# Patient Record
Sex: Female | Born: 1985 | Race: Black or African American | Hispanic: No | Marital: Single | Smoking: Never smoker
Health system: Southern US, Community
[De-identification: ages and names within clinical notes are randomized; demographics above are authoritative.]

## PROBLEM LIST (undated history)

## (undated) DIAGNOSIS — N760 Acute vaginitis: Secondary | ICD-10-CM

## (undated) DIAGNOSIS — B9689 Other specified bacterial agents as the cause of diseases classified elsewhere: Secondary | ICD-10-CM

## (undated) DIAGNOSIS — Z6832 Body mass index (BMI) 32.0-32.9, adult: Secondary | ICD-10-CM

## (undated) DIAGNOSIS — A749 Chlamydial infection, unspecified: Secondary | ICD-10-CM

## (undated) HISTORY — PX: WISDOM TOOTH EXTRACTION: SHX21

## (undated) HISTORY — DX: Body mass index (BMI) 32.0-32.9, adult: Z68.32

## (undated) HISTORY — DX: Chlamydial infection, unspecified: A74.9

## (undated) HISTORY — DX: Other specified bacterial agents as the cause of diseases classified elsewhere: B96.89

## (undated) HISTORY — DX: Other specified bacterial agents as the cause of diseases classified elsewhere: N76.0

---

## 2007-05-08 ENCOUNTER — Emergency Department: Payer: Self-pay | Admitting: Emergency Medicine

## 2007-05-10 ENCOUNTER — Emergency Department: Payer: Self-pay | Admitting: Emergency Medicine

## 2011-02-18 ENCOUNTER — Observation Stay: Payer: Self-pay

## 2011-02-19 ENCOUNTER — Inpatient Hospital Stay: Payer: Self-pay

## 2013-04-24 ENCOUNTER — Ambulatory Visit: Payer: Self-pay

## 2017-02-22 ENCOUNTER — Ambulatory Visit: Payer: Self-pay | Admitting: Certified Nurse Midwife

## 2017-02-23 ENCOUNTER — Ambulatory Visit (INDEPENDENT_AMBULATORY_CARE_PROVIDER_SITE_OTHER): Payer: BLUE CROSS/BLUE SHIELD | Admitting: Obstetrics and Gynecology

## 2017-02-23 ENCOUNTER — Encounter: Payer: Self-pay | Admitting: Obstetrics and Gynecology

## 2017-02-23 VITALS — BP 110/60 | HR 68 | Wt 220.0 lb

## 2017-02-23 DIAGNOSIS — N3001 Acute cystitis with hematuria: Secondary | ICD-10-CM

## 2017-02-23 DIAGNOSIS — R350 Frequency of micturition: Secondary | ICD-10-CM

## 2017-02-23 LAB — POCT URINALYSIS DIPSTICK
BILIRUBIN UA: NEGATIVE
Glucose, UA: NEGATIVE
Nitrite, UA: NEGATIVE
PH UA: 6 (ref 5.0–8.0)
PROTEIN UA: NEGATIVE
SPEC GRAV UA: 1.01 (ref 1.010–1.025)
Urobilinogen, UA: NEGATIVE E.U./dL — AB

## 2017-02-23 MED ORDER — NITROFURANTOIN MONOHYD MACRO 100 MG PO CAPS: 100.0000 mg | ORAL_CAPSULE | Freq: Two times a day (BID) | ORAL | 0 refills | Status: AC

## 2017-02-23 NOTE — Progress Notes (Signed)
Chief Complaint  Patient presents with  . Urinary Tract Infection    Urinary Frequency not much comes out x few days    HPI:      Brittany Allen is a 31 y.o. No obstetric history on file. who LMP was Patient's last menstrual period was 01/31/2017 (exact date)., presents today for urinary frequency, dysuria for the past 6 days. She denies any LBP, belly pain, fevers, vag sx, or vaginal bleeding. She had a UTI yrs ago. She has not used any meds to treat.    There are no active problems to display for this patient. History reviewed. No pertinent past medical history.   Family History  Problem Relation Age of Onset  . Colon cancer Mother 5952  . Lymphoma Father 5660    Social History   Social History  . Marital status: Single    Spouse name: N/A  . Number of children: N/A  . Years of education: N/A   Occupational History  . Not on file.   Social History Main Topics  . Smoking status: Never Smoker  . Smokeless tobacco: Never Used  . Alcohol use Yes  . Drug use: No  . Sexual activity: Yes    Partners: Male    Birth control/ protection: None   Other Topics Concern  . Not on file   Social History Narrative  . No narrative on file     Current Outpatient Prescriptions:  .  nitrofurantoin, macrocrystal-monohydrate, (MACROBID) 100 MG capsule, Take 1 capsule (100 mg total) by mouth 2 (two) times daily., Disp: 14 capsule, Rfl: 0  Review of Systems  Constitutional: Negative for fever.  Gastrointestinal: Negative for blood in stool, constipation, diarrhea, nausea and vomiting.  Genitourinary: Positive for dysuria and frequency. Negative for dyspareunia, flank pain, hematuria, urgency, vaginal bleeding, vaginal discharge and vaginal pain.  Musculoskeletal: Negative for back pain.  Skin: Negative for rash.    OBJECTIVE:   Vitals:  BP 110/60 (BP Location: Left Arm, Patient Position: Sitting, Cuff Size: Large)   Pulse 68   Wt 220 lb (99.8 kg)   LMP 01/31/2017 (Exact  Date)   Physical Exam  Constitutional: She is well-developed, well-nourished, and in no distress.  Abdominal: There is no CVA tenderness.  Nursing note and vitals reviewed.   Results: Results for orders placed or performed in visit on 02/23/17 (from the past 24 hour(s))  POCT urinalysis dipstick     Status: Abnormal   Collection Time: 02/23/17 11:07 AM  Result Value Ref Range   Color, UA Gold    Clarity, UA Clear    Glucose, UA Negative    Bilirubin, UA negative    Ketones, UA Trace    Spec Grav, UA 1.010 1.010 - 1.025   Blood, UA 2+    pH, UA 6.0 5.0 - 8.0   Protein, UA Negative    Urobilinogen, UA negative (A) 0.2 or 1.0 E.U./dL   Nitrite, UA Negative    Leukocytes, UA Large (3+) (A) Negative     Assessment/Plan: Acute cystitis with hematuria - Rx macrobid. Check C&S. F/u prn.  - Plan: Urine culture, nitrofurantoin, macrocrystal-monohydrate, (MACROBID) 100 MG capsule  Urinary frequency - Plan: POCT urinalysis dipstick, Urine culture   Meds ordered this encounter  Medications  . nitrofurantoin, macrocrystal-monohydrate, (MACROBID) 100 MG capsule    Sig: Take 1 capsule (100 mg total) by mouth 2 (two) times daily.    Dispense:  14 capsule    Refill:  0  Return if symptoms worsen or fail to improve.  Alicia B. Copland, PA-C 02/23/2017 11:17 AM

## 2017-02-25 LAB — URINE CULTURE

## 2017-07-26 ENCOUNTER — Encounter: Payer: Self-pay | Admitting: Maternal Newborn

## 2017-07-26 ENCOUNTER — Ambulatory Visit (INDEPENDENT_AMBULATORY_CARE_PROVIDER_SITE_OTHER): Payer: BLUE CROSS/BLUE SHIELD | Admitting: Maternal Newborn

## 2017-07-26 VITALS — BP 104/62 | HR 74 | Ht 66.0 in | Wt 215.0 lb

## 2017-07-26 DIAGNOSIS — Z01419 Encounter for gynecological examination (general) (routine) without abnormal findings: Secondary | ICD-10-CM

## 2017-07-26 DIAGNOSIS — Z113 Encounter for screening for infections with a predominantly sexual mode of transmission: Secondary | ICD-10-CM | POA: Diagnosis not present

## 2017-07-26 DIAGNOSIS — Z124 Encounter for screening for malignant neoplasm of cervix: Secondary | ICD-10-CM | POA: Diagnosis not present

## 2017-07-26 DIAGNOSIS — Z3169 Encounter for other general counseling and advice on procreation: Secondary | ICD-10-CM | POA: Diagnosis not present

## 2017-07-26 LAB — HM PAP SMEAR: HM Pap smear: NORMAL

## 2017-07-26 NOTE — Progress Notes (Signed)
Gynecology Annual Exam  PCP: System, Pcp Not In  Chief Complaint:  Chief Complaint  Patient presents with  . Gynecologic Exam    History of Present Illness: Patient is a 31 y.o. G1P1001 presents for annual exam. The patient has no complaints today.   LMP: Patient's last menstrual period was 07/22/2017 (exact date). Average Interval: regular, 28 days Duration of flow: 4-5 days Heavy Menses: no Clots: usually not, but noticed a few two cycles ago Intermenstrual Bleeding: usually not, but noticed some spotting a few days before last cycle Postcoital Bleeding: no Dysmenorrhea: no  The patient is sexually active. She currently uses nothing for contraception. She admits to dyspareunia.  The patient does not perform self breast exams.  There is no notable family history of breast or ovarian cancer in her family.  The patient wears seatbelts: yes.   The patient has regular exercise: yes.    The patient denies current symptoms of depression.    Review of Systems  Constitutional: Negative for chills, fever and weight loss.  HENT: Negative for congestion, sinus pain and sore throat.   Eyes: Negative.   Respiratory: Negative for cough, shortness of breath and wheezing.   Cardiovascular: Negative for chest pain and palpitations.  Gastrointestinal: Negative for abdominal pain, constipation, diarrhea, heartburn and nausea.  Genitourinary: Negative for dysuria, frequency and urgency.  Musculoskeletal: Negative.   Skin: Negative.   Neurological: Negative for dizziness, tingling and headaches.  Endo/Heme/Allergies: Negative.   Psychiatric/Behavioral: Negative for depression. The patient is not nervous/anxious.   All other systems reviewed and are negative.   Past Medical History:  Past Medical History:  Diagnosis Date  . Bacterial vaginitis   . Chlamydia     Past Surgical History:  History reviewed. No pertinent surgical history.  Gynecologic History:  Patient's last  menstrual period was 07/22/2017 (exact date). Contraception: none Last Pap: 12/28/2014 Results were: NIL  Obstetric History: G1P1001  Family History:  Family History  Problem Relation Age of Onset  . Colon cancer Mother 70  . Lymphoma Father 60  . Diabetes Maternal Aunt     Social History:  Social History   Social History  . Marital status: Single    Spouse name: N/A  . Number of children: N/A  . Years of education: N/A   Occupational History  . Not on file.   Social History Main Topics  . Smoking status: Never Smoker  . Smokeless tobacco: Never Used  . Alcohol use Yes  . Drug use: No  . Sexual activity: Yes    Partners: Male    Birth control/ protection: None   Other Topics Concern  . Not on file   Social History Narrative  . No narrative on file    Allergies:  No Known Allergies  Medications: Prior to Admission medications   Not on File    Physical Exam Vitals: Blood pressure 104/62, pulse 74, height  (1.676 m), weight 215 lb (97.5 kg), last menstrual period 07/22/2017.  General: NAD HEENT: normocephalic, anicteric Thyroid: no enlargement, no palpable nodules Pulmonary: No increased work of breathing, CTAB Cardiovascular: RRR, distal pulses 2+ Breast: Breast symmetrical, no tenderness, no palpable nodules or masses, no skin or nipple retraction present, no nipple discharge.  No axillary or supraclavicular lymphadenopathy. Abdomen: NABS, soft, non-tender, non-distended.  Umbilicus without lesions.  No hepatomegaly, splenomegaly or masses palpable. No evidence of hernia  Genitourinary:  External: Normal external female genitalia.  Normal urethral meatus, normal  Bartholin's and Skene's  glands.    Vagina: Normal vaginal mucosa, no evidence of prolapse.    Cervix: Grossly normal in appearance, no bleeding  Uterus: Non-enlarged, mobile, normal contour.  No CMT  Adnexa: ovaries non-enlarged, no adnexal masses  Rectal: deferred  Lymphatic: no evidence  of inguinal lymphadenopathy Extremities: no edema, erythema, or tenderness Neurologic: Grossly intact Psychiatric: mood appropriate, affect full  Assessment: 31 y.o. G1P1001 routine annual exam. Patient is trying to conceive.  Plan: Problem List Items Addressed This Visit   Visit Diagnoses    Encounter for annual routine gynecological examination    -  Primary   Pap smear for cervical cancer screening       Relevant Orders   IGP,CtNgTv,Apt HPV,rfx16/18,45   Screen for STD (sexually transmitted disease)       Relevant Orders   IGP,CtNgTv,Apt HPV,rfx16/18,45   Pre-conception counseling          1) STI screening was offered and accepted.  2) ASCCP guidelines and rationale discussed.  Patient opts for every 3 year screening interval.  3) Conception - Patient desires pregnancy. Discussed timing of intercourse, signs of fertility, explained that she should start to take prenatal vitamins. Advised using lubrication to help with pain on initiation of intercourse. Has been trying for a few months. Advised to return to care if unsuccessful for > 1 year.  4) Routine healthcare maintenance including cholesterol, diabetes screening discussed: Declines.  5) Follow up 1 year for routine annual exam.  Marcelyn Bruins, CNM 07/26/2017  2:04 PM

## 2017-07-29 LAB — IGP,CTNGTV,APT HPV,RFX16/18,45
CHLAMYDIA, NUC. ACID AMP: NEGATIVE
Gonococcus, Nuc. Acid Amp: NEGATIVE
HPV APTIMA: NEGATIVE
PAP SMEAR COMMENT: 0
Trich vag by NAA: POSITIVE — AB

## 2017-07-30 ENCOUNTER — Other Ambulatory Visit: Payer: Self-pay | Admitting: Maternal Newborn

## 2017-07-30 DIAGNOSIS — A599 Trichomoniasis, unspecified: Secondary | ICD-10-CM

## 2017-07-30 MED ORDER — METRONIDAZOLE 500 MG PO TABS: 2000.0000 mg | ORAL_TABLET | Freq: Once | ORAL | 0 refills | Status: AC

## 2017-07-30 NOTE — Progress Notes (Signed)
Spoke with patient by phone and notified of positive test result for trichomoniasis; Rx for metronidazole 2g sent to pharmacy. Patient aware that partner should also be treated and to avoid alchool for 24 h after taking metronidazole.  Marcelyn BruinsJacelyn Schmid, CNM 07/30/2017  12:01 PM

## 2017-10-08 ENCOUNTER — Other Ambulatory Visit: Payer: Self-pay | Admitting: Maternal Newborn

## 2017-10-08 ENCOUNTER — Telehealth: Payer: Self-pay

## 2017-10-08 DIAGNOSIS — Z30011 Encounter for initial prescription of contraceptive pills: Secondary | ICD-10-CM

## 2017-10-08 MED ORDER — NORETHIN-ETH ESTRAD-FE BIPHAS 1 MG-10 MCG / 10 MCG PO TABS: 1.0000 | ORAL_TABLET | Freq: Every day | ORAL | 3 refills | Status: DC

## 2017-10-08 NOTE — Telephone Encounter (Signed)
Pt had discussed OCP w/JS at recent AE. Pt ready to start OCP & inquiring what next steps are. Cb#[phone]

## 2017-10-08 NOTE — Progress Notes (Signed)
Patient called to request OCPs, sent Rx for Lo LoEstrin after counseling by phone (annual exam with me 07/2017).  Marcelyn BruinsJacelyn Angel Hobdy, CNM 10/08/2017  4:53 PM

## 2017-10-15 NOTE — Telephone Encounter (Signed)
Pt p/u her OCP yesterday. She has a couple of questions in regards to starting it. Cb#[phone]

## 2017-10-15 NOTE — Telephone Encounter (Signed)
Pt states instructions in box say to start pack on first day of cycle. She remembers from previous OCP's to start on Sunday after cycle starts. Verified w/Jaci & relayed to patient ok to start on Sunday, just use back up for first month.

## 2019-01-27 ENCOUNTER — Other Ambulatory Visit: Payer: Self-pay | Admitting: Maternal Newborn

## 2019-01-27 DIAGNOSIS — Z30011 Encounter for initial prescription of contraceptive pills: Secondary | ICD-10-CM

## 2019-02-16 ENCOUNTER — Telehealth: Payer: Self-pay | Admitting: Maternal Newborn

## 2019-02-16 DIAGNOSIS — Z30011 Encounter for initial prescription of contraceptive pills: Secondary | ICD-10-CM

## 2019-02-16 NOTE — Telephone Encounter (Signed)
Patient scheduled for annual 6/29 with JS, will need refill on bc to get her to this appointment, [facility] [address].

## 2019-02-17 MED ORDER — NORETHIN-ETH ESTRAD-FE BIPHAS 1 MG-10 MCG / 10 MCG PO TABS: 1.0000 | ORAL_TABLET | Freq: Every day | ORAL | 0 refills | Status: DC

## 2019-02-17 NOTE — Telephone Encounter (Signed)
Pt aware.

## 2019-02-22 ENCOUNTER — Telehealth: Payer: Self-pay

## 2019-02-22 NOTE — Telephone Encounter (Signed)
Pt calling for refill of bc; pharm doesn't have it; she is out.  [phone]  Adv I will call pharm.  Pharm states needs PA.  Adv pt who states she has new ins.  Adv also sample up front for her to p/u.  To bring new ins card c her so we can scan it in.  Also, take new ins card to pharm so they can run it.

## 2019-04-10 ENCOUNTER — Other Ambulatory Visit: Payer: Self-pay

## 2019-04-10 ENCOUNTER — Encounter: Payer: Self-pay | Admitting: Maternal Newborn

## 2019-04-10 ENCOUNTER — Ambulatory Visit (INDEPENDENT_AMBULATORY_CARE_PROVIDER_SITE_OTHER): Payer: PRIVATE HEALTH INSURANCE | Admitting: Maternal Newborn

## 2019-04-10 VITALS — BP 110/70 | Ht 65.0 in | Wt 215.8 lb

## 2019-04-10 DIAGNOSIS — Z3041 Encounter for surveillance of contraceptive pills: Secondary | ICD-10-CM

## 2019-04-10 DIAGNOSIS — Z01419 Encounter for gynecological examination (general) (routine) without abnormal findings: Secondary | ICD-10-CM | POA: Diagnosis not present

## 2019-04-10 MED ORDER — LO LOESTRIN FE 1 MG-10 MCG / 10 MCG PO TABS: 1.0000 | ORAL_TABLET | Freq: Every day | ORAL | 3 refills | Status: DC

## 2019-04-10 NOTE — Progress Notes (Signed)
Gynecology Annual Exam  PCP: System, Pcp Not In  Chief Complaint:  Chief Complaint  Patient presents with  . Gynecologic Exam    no concerns    History of Present Illness: Patient is a 33 y.o. G1P1001 presenting for an annual exam. The patient has no complaints today.   LMP: Patient's last menstrual period was 04/04/2019 (exact date). Average Interval: regular, 28 days Duration of flow: 5 days Heavy Menses: no Clots: not usually, did have some with cycle before the previous one Intermenstrual Bleeding: no Postcoital Bleeding: no Dysmenorrhea: no  The patient is sexually active. She currently uses OCP (estrogen/progesterone) for contraception. She does not have dyspareunia.  The patient does perform self breast exams.  There is no notable family history of breast or ovarian cancer in her family.  The patient wears seatbelts: yes.   The patient has regular exercise: yes.    The patient does not have current symptoms of depression.    Review of Systems  Constitutional: Negative.   HENT: Negative.   Eyes: Negative.   Respiratory: Negative for shortness of breath and wheezing.   Cardiovascular: Negative for chest pain and palpitations.  Gastrointestinal: Negative for abdominal pain, diarrhea and nausea.  Genitourinary: Negative.   Musculoskeletal: Negative.   Skin: Negative.   Neurological: Negative.   Endo/Heme/Allergies: Negative.   Psychiatric/Behavioral: Negative.     Past Medical History:  Past Medical History:  Diagnosis Date  . Bacterial vaginitis   . Chlamydia     Past Surgical History:  History reviewed. No pertinent surgical history.  Gynecologic History:  Patient's last menstrual period was 04/04/2019 (exact date). Contraception: OCP (estrogen/progesterone) Last Pap: 07/26/2017. Results were: NIL and HR HPV negative   Obstetric History: G1P1001  Family History:  Family History  Problem Relation Age of Onset  . Colon cancer Mother 2852  .  Lymphoma Father 3160  . Diabetes Maternal Aunt     Social History:  Social History   Socioeconomic History  . Marital status: Single    Spouse name: Not on file  . Number of children: Not on file  . Years of education: Not on file  . Highest education level: Not on file  Occupational History  . Not on file  Social Needs  . Financial resource strain: Not on file  . Food insecurity    Worry: Not on file    Inability: Not on file  . Transportation needs    Medical: Not on file    Non-medical: Not on file  Tobacco Use  . Smoking status: Never Smoker  . Smokeless tobacco: Never Used  Substance and Sexual Activity  . Alcohol use: Yes    Comment: occ  . Drug use: No  . Sexual activity: Yes    Partners: Male    Birth control/protection: Pill  Lifestyle  . Physical activity    Days per week: Not on file    Minutes per session: Not on file  . Stress: Not on file  Relationships  . Social Musicianconnections    Talks on phone: Not on file    Gets together: Not on file    Attends religious service: Not on file    Active member of club or organization: Not on file    Attends meetings of clubs or organizations: Not on file    Relationship status: Not on file  . Intimate partner violence    Fear of current or ex partner: Not on file    Emotionally abused:  Not on file    Physically abused: Not on file    Forced sexual activity: Not on file  Other Topics Concern  . Not on file  Social History Narrative  . Not on file    Allergies:  No Known Allergies  Medications: Prior to Admission medications   Medication Sig Start Date End Date Taking? Authorizing Provider  Norethindrone-Ethinyl Estradiol-Fe Biphas (LO LOESTRIN FE) 1 MG-10 MCG / 10 MCG tablet Take 1 tablet by mouth daily. 02/17/19   Rexene Agent, CNM    Physical Exam Vitals: Blood pressure 110/70, height 5\' 5"  (1.651 m), weight 215 lb 12.8 oz (97.9 kg), last menstrual period 04/04/2019.  General: NAD HEENT:  normocephalic, anicteric Thyroid: no enlargement, no palpable nodules Pulmonary: No increased work of breathing, CTAB Cardiovascular: RRR, no murmurs, rubs, or gallops Breast: Breasts symmetrical, no tenderness, no palpable nodules or masses, no skin or nipple retraction present, no nipple discharge.  No axillary or supraclavicular lymphadenopathy. Abdomen: Soft, non-tender, non-distended.  Umbilicus without lesions.  No hepatomegaly, splenomegaly or masses palpable. No evidence of hernia  Genitourinary: Pelvic exam deferred after shared decision making, asymptomatic and no screening today Extremities: no edema, erythema, or tenderness Neurologic: Grossly intact Psychiatric: mood appropriate, affect full  Assessment: 33 y.o. G1P1001 annual exam  Plan: Problem List Items Addressed This Visit    None    Visit Diagnoses    Women's annual routine gynecological examination    -  Primary   Oral contraceptive pill surveillance       Relevant Medications   Norethindrone-Ethinyl Estradiol-Fe Biphas (LO LOESTRIN FE) 1 MG-10 MCG / 10 MCG tablet      1) STI screening was offered and declined  2) ASCCP guidelines and rationale discussed.  Patient opts for every 3 year screening interval  3) Contraception - She wishes to continue on oral contraceptives. No adverse effects and she is satisfied with this method.  4) Routine healthcare maintenance including cholesterol, diabetes screening: Declines  5) Follow up 1 year for annual exam.  Avel Sensor, CNM 04/10/2019  2:00 PM

## 2019-04-10 NOTE — Patient Instructions (Signed)
Preventive Care 21-33 Years Old, Female Preventive care refers to visits with your health care provider and lifestyle choices that can promote health and wellness. This includes:  A yearly physical exam. This may also be called an annual well check.  Regular dental visits and eye exams.  Immunizations.  Screening for certain conditions.  Healthy lifestyle choices, such as eating a healthy diet, getting regular exercise, not using drugs or products that contain nicotine and tobacco, and limiting alcohol use. What can I expect for my preventive care visit? Physical exam Your health care provider will check your:  Height and weight. This may be used to calculate body mass index (BMI), which tells if you are at a healthy weight.  Heart rate and blood pressure.  Skin for abnormal spots. Counseling Your health care provider may ask you questions about your:  Alcohol, tobacco, and drug use.  Emotional well-being.  Home and relationship well-being.  Sexual activity.  Eating habits.  Work and work environment.  Method of birth control.  Menstrual cycle.  Pregnancy history. What immunizations do I need?  Influenza (flu) vaccine  This is recommended every year. Tetanus, diphtheria, and pertussis (Tdap) vaccine  You may need a Td booster every 10 years. Varicella (chickenpox) vaccine  You may need this if you have not been vaccinated. Human papillomavirus (HPV) vaccine  If recommended by your health care provider, you may need three doses over 6 months. Measles, mumps, and rubella (MMR) vaccine  You may need at least one dose of MMR. You may also need a second dose. Meningococcal conjugate (MenACWY) vaccine  One dose is recommended if you are age 19-21 years and a first-year college student living in a residence hall, or if you have one of several medical conditions. You may also need additional booster doses. Pneumococcal conjugate (PCV13) vaccine  You may need  this if you have certain conditions and were not previously vaccinated. Pneumococcal polysaccharide (PPSV23) vaccine  You may need one or two doses if you smoke cigarettes or if you have certain conditions. Hepatitis A vaccine  You may need this if you have certain conditions or if you travel or work in places where you may be exposed to hepatitis A. Hepatitis B vaccine  You may need this if you have certain conditions or if you travel or work in places where you may be exposed to hepatitis B. Haemophilus influenzae type b (Hib) vaccine  You may need this if you have certain conditions. You may receive vaccines as individual doses or as more than one vaccine together in one shot (combination vaccines). Talk with your health care provider about the risks and benefits of combination vaccines. What tests do I need?  Blood tests  Lipid and cholesterol levels. These may be checked every 5 years starting at age 20.  Hepatitis C test.  Hepatitis B test. Screening  Diabetes screening. This is done by checking your blood sugar (glucose) after you have not eaten for a while (fasting).  Sexually transmitted disease (STD) testing.  BRCA-related cancer screening. This may be done if you have a family history of breast, ovarian, tubal, or peritoneal cancers.  Pelvic exam and Pap test. This may be done every 3 years starting at age 21. Starting at age 30, this may be done every 5 years if you have a Pap test in combination with an HPV test. Talk with your health care provider about your test results, treatment options, and if necessary, the need for more tests.   Follow these instructions at home: Eating and drinking   Eat a diet that includes fresh fruits and vegetables, whole grains, lean protein, and low-fat dairy.  Take vitamin and mineral supplements as recommended by your health care provider.  Do not drink alcohol if: ? Your health care provider tells you not to drink. ? You are  pregnant, may be pregnant, or are planning to become pregnant.  If you drink alcohol: ? Limit how much you have to 0-1 drink a day. ? Be aware of how much alcohol is in your drink. In the [place] one drink equals one 12 oz bottle of beer (355 mL), one 5 oz glass of wine (148 mL), or one 1 oz glass of hard liquor (44 mL). Lifestyle  Take daily care of your teeth and gums.  Stay active. Exercise for at least 30 minutes on 5 or more days each week.  Do not use any products that contain nicotine or tobacco, such as cigarettes, e-cigarettes, and chewing tobacco. If you need help quitting, ask your health care provider.  If you are sexually active, practice safe sex. Use a condom or other form of birth control (contraception) in order to prevent pregnancy and STIs (sexually transmitted infections). If you plan to become pregnant, see your health care provider for a preconception visit. What's next?  Visit your health care provider once a year for a well check visit.  Ask your health care provider how often you should have your eyes and teeth checked.  Stay up to date on all vaccines. This information is not intended to replace advice given to you by your health care provider. Make sure you discuss any questions you have with your health care provider. Document Released: 11/24/2001 Document Revised: 06/09/2018 Document Reviewed: 06/09/2018 Elsevier Patient Education  2020 [employer]vier Inc.  

## 2019-05-11 ENCOUNTER — Telehealth: Payer: Self-pay

## 2019-05-11 ENCOUNTER — Other Ambulatory Visit: Payer: Self-pay | Admitting: Obstetrics and Gynecology

## 2019-05-11 DIAGNOSIS — Z3041 Encounter for surveillance of contraceptive pills: Secondary | ICD-10-CM

## 2019-05-11 MED ORDER — LEVONORGESTREL-ETHINYL ESTRAD 0.1-20 MG-MCG PO TABS: 1.0000 | ORAL_TABLET | Freq: Every day | ORAL | 11 refills | Status: DC

## 2019-05-11 NOTE — Telephone Encounter (Signed)
Pt calling triage today requesting different OCP, one she is RX'ed is not covered by insurance. Please advise pt

## 2019-05-11 NOTE — Telephone Encounter (Signed)
Please advise. Thank you

## 2019-05-11 NOTE — Telephone Encounter (Signed)
Completed, please notify patient- thank you

## 2019-05-12 NOTE — Telephone Encounter (Signed)
Called pt to let her know the new rx was sent and she stated she wanted to ask [facility] what the difference was in a pill the insurance told her about which was Junel FE as appose to the [facility]

## 2019-05-12 NOTE — Telephone Encounter (Signed)
completed

## 2019-05-12 NOTE — Telephone Encounter (Signed)
Returned call. No answer. Left message.  Aviane has a slightly higher amount of estrogen but is similar to lolo.

## 2019-05-12 NOTE — Telephone Encounter (Signed)
Pt returning Dr. Gennette Pac call. Requesting return call. Cb#[phone]

## 2019-05-12 NOTE — Telephone Encounter (Signed)
Spoke w/pt. Inquired if she received Dr. Gennette Pac vm. She did, she states Dr. Gilman Schmidt asked her to call back. Msg to Dr. Gilman Schmidt.

## 2019-07-28 ENCOUNTER — Telehealth: Payer: Self-pay

## 2019-07-28 NOTE — Telephone Encounter (Signed)
Pt calling for refill of bcp; last one she is not sure insurance accepted; she's trying to figure out what she can get to be put back on bc.  [phone]  Pt aware refills are already at the pharm.  To contact them.

## 2020-04-12 ENCOUNTER — Ambulatory Visit: Payer: PRIVATE HEALTH INSURANCE | Admitting: Obstetrics

## 2020-04-17 ENCOUNTER — Ambulatory Visit: Payer: PRIVATE HEALTH INSURANCE | Admitting: Obstetrics and Gynecology

## 2020-05-21 ENCOUNTER — Ambulatory Visit (INDEPENDENT_AMBULATORY_CARE_PROVIDER_SITE_OTHER): Payer: PRIVATE HEALTH INSURANCE | Admitting: Obstetrics and Gynecology

## 2020-05-21 ENCOUNTER — Other Ambulatory Visit: Payer: Self-pay

## 2020-05-21 ENCOUNTER — Encounter: Payer: Self-pay | Admitting: Obstetrics and Gynecology

## 2020-05-21 VITALS — BP 122/74 | Ht 66.0 in | Wt 213.0 lb

## 2020-05-21 DIAGNOSIS — Z30011 Encounter for initial prescription of contraceptive pills: Secondary | ICD-10-CM

## 2020-05-21 DIAGNOSIS — Z01419 Encounter for gynecological examination (general) (routine) without abnormal findings: Secondary | ICD-10-CM | POA: Diagnosis not present

## 2020-05-21 MED ORDER — DROSPIRENONE-ETHINYL ESTRADIOL 3-0.02 MG PO TABS: 1.0000 | ORAL_TABLET | Freq: Every day | ORAL | 3 refills | Status: DC

## 2020-05-21 NOTE — Progress Notes (Signed)
PCP:  System, Pcp Not In   Chief Complaint  Patient presents with  . Annual Exam     HPI:      [name] is a 34 y.o. G1P1001 whose LMP was Patient's last menstrual period was 05/14/2020., presents today for her annual examination.  Her menses are regular every 28-30 days, lasting 4-5 days, mod flow.  Dysmenorrhea none. She does not have intermenstrual bleeding.  Sex activity: single partner, contraception - condoms. Was on OCPs until a few months ago; stopped due to insurance coverage. Would like to restart. No hx of HTN, DVTs, migraines with aura.  Last Pap: 07/26/17 Results were: no abnormalities /neg HPV DNA   There is no FH of breast cancer. There is no FH of ovarian cancer. The patient does not do self-breast exams. She has a FH of colon cancer in her mom age 62. Doesn't qualify for cancer genetic testing but should start colonoscopies age 79.  Tobacco use: The patient denies current or previous tobacco use. Alcohol use: none No drug use.  Exercise: moderately active  She does get adequate calcium but not Vitamin D in her diet.    Upstream - 05/21/20 1515      Pregnancy Intention Screening   Does the patient want to become pregnant in the next year? Yes    Does the patient's partner want to become pregnant in the next year? Yes    Would the patient like to discuss contraceptive options today? Yes      Contraception Wrap Up   Contraception Counseling Provided Yes          The pregnancy intention screening data noted above was reviewed. Potential methods of contraception were discussed. The patient elected to proceed with Oral Contraceptive.    Past Medical History:  Diagnosis Date  . Bacterial vaginitis   . Chlamydia     History reviewed. No pertinent surgical history.  Family History  Problem Relation Age of Onset  . Colon cancer Mother 92  . Lymphoma Father 45  . Diabetes Maternal Aunt     Social History   Socioeconomic History  . Marital  status: Single    Spouse name: Not on file  . Number of children: Not on file  . Years of education: Not on file  . Highest education level: Not on file  Occupational History  . Not on file  Tobacco Use  . Smoking status: Never Smoker  . Smokeless tobacco: Never Used  Vaping Use  . Vaping Use: Never used  Substance and Sexual Activity  . Alcohol use: Yes    Comment: occ  . Drug use: No  . Sexual activity: Yes    Partners: Male    Birth control/protection: None  Other Topics Concern  . Not on file  Social History Narrative  . Not on file   Social Determinants of Health   Financial Resource Strain:   . Difficulty of Paying Living Expenses:   Food Insecurity:   . Worried About Programme researcher, broadcasting/film/video in the Last Year:   . Barista in the Last Year:   Transportation Needs:   . Freight forwarder (Medical):   Marland Kitchen Lack of Transportation (Non-Medical):   Physical Activity:   . Days of Exercise per Week:   . Minutes of Exercise per Session:   Stress:   . Feeling of Stress :   Social Connections:   . Frequency of Communication with Friends and Family:   .  Frequency of Social Gatherings with Friends and Family:   . Attends Religious Services:   . Active Member of Clubs or Organizations:   . Attends Banker Meetings:   Marland Kitchen Marital Status:   Intimate Partner Violence:   . Fear of Current or Ex-Partner:   . Emotionally Abused:   Marland Kitchen Physically Abused:   . Sexually Abused:      Current Outpatient Medications:  .  drospirenone-ethinyl estradiol (YAZ) 3-0.02 MG tablet, Take 1 tablet by mouth daily., Disp: 84 tablet, Rfl: 3     ROS:  Review of Systems  Constitutional: Negative for fatigue, fever and unexpected weight change.  Respiratory: Negative for cough, shortness of breath and wheezing.   Cardiovascular: Negative for chest pain, palpitations and leg swelling.  Gastrointestinal: Negative for blood in stool, constipation, diarrhea, nausea and  vomiting.  Endocrine: Negative for cold intolerance, heat intolerance and polyuria.  Genitourinary: Negative for dyspareunia, dysuria, flank pain, frequency, genital sores, hematuria, menstrual problem, pelvic pain, urgency, vaginal bleeding, vaginal discharge and vaginal pain.  Musculoskeletal: Negative for back pain, joint swelling and myalgias.  Skin: Negative for rash.  Neurological: Negative for dizziness, syncope, light-headedness, numbness and headaches.  Hematological: Negative for adenopathy.  Psychiatric/Behavioral: Negative for agitation, confusion, sleep disturbance and suicidal ideas. The patient is not nervous/anxious.   BREAST: No symptoms   Objective: BP 122/74   Ht 5\' 6"  (1.676 m)   Wt 213 lb (96.6 kg)   LMP 05/14/2020   BMI 34.38 kg/m    Physical Exam Constitutional:      Appearance: She is well-developed.  Genitourinary:     Vulva, vagina, cervix, uterus, right adnexa and left adnexa normal.     No vulval lesion or tenderness noted.     No vaginal discharge, erythema or tenderness.     No cervical polyp.     Uterus is not enlarged or tender.     No right or left adnexal mass present.     Right adnexa not tender.     Left adnexa not tender.  Neck:     Thyroid: No thyromegaly.  Cardiovascular:     Rate and Rhythm: Normal rate and regular rhythm.     Heart sounds: Normal heart sounds. No murmur heard.   Pulmonary:     Effort: Pulmonary effort is normal.     Breath sounds: Normal breath sounds.  Chest:     Breasts:        Right: No mass, nipple discharge, skin change or tenderness.        Left: No mass, nipple discharge, skin change or tenderness.  Abdominal:     Palpations: Abdomen is soft.     Tenderness: There is no abdominal tenderness. There is no guarding.  Musculoskeletal:        General: Normal range of motion.     Cervical back: Normal range of motion.  Neurological:     General: No focal deficit present.     Mental Status: She is alert and  oriented to person, place, and time.     Cranial Nerves: No cranial nerve deficit.  Skin:    General: Skin is warm and dry.  Psychiatric:        Mood and Affect: Mood normal.        Behavior: Behavior normal.        Thought Content: Thought content normal.        Judgment: Judgment normal.  Vitals reviewed.     Assessment/Plan: Encounter  for annual routine gynecological examination  Encounter for initial prescription of contraceptive pills - Plan: drospirenone-ethinyl estradiol (YAZ) 3-0.02 MG tablet; OCP start today after neg UPT or with next menses. Condoms. Rx eRxd. Will change if there is ins coverage. F/u prn.   Meds ordered this encounter  Medications  . drospirenone-ethinyl estradiol (YAZ) 3-0.02 MG tablet    Sig: Take 1 tablet by mouth daily.    Dispense:  84 tablet    Refill:  3    Order Specific Question:   Supervising Provider    Answer:   Nadara Mustard [[id number]]             GYN counsel adequate intake of calcium and vitamin D, diet and exercise     F/U  Return in about 1 year (around 05/21/2021).  Eain Mullendore B. Linzy Darling, PA-C 05/21/2020 3:37 PM

## 2020-05-21 NOTE — Patient Instructions (Signed)
I value your feedback and entrusting us with your care. If you get a [facility] patient survey, I would appreciate you taking the time to let us know about your experience today. Thank you!  As of September 21, 2019, your lab results will be released to your MyChart immediately, before I even have a chance to see them. Please give me time to review them and contact you if there are any abnormalities. Thank you for your patience.  

## 2020-07-28 ENCOUNTER — Emergency Department: Payer: HRSA Program

## 2020-07-28 ENCOUNTER — Emergency Department
Admission: EM | Admit: 2020-07-28 | Discharge: 2020-07-28 | Disposition: A | Discharge status: Home / Self Care | Payer: HRSA Program | Attending: Emergency Medicine | Admitting: Emergency Medicine

## 2020-07-28 ENCOUNTER — Other Ambulatory Visit: Payer: Self-pay

## 2020-07-28 DIAGNOSIS — U071 COVID-19: Secondary | ICD-10-CM | POA: Diagnosis not present

## 2020-07-28 DIAGNOSIS — N12 Tubulo-interstitial nephritis, not specified as acute or chronic: Secondary | ICD-10-CM | POA: Diagnosis not present

## 2020-07-28 DIAGNOSIS — R509 Fever, unspecified: Secondary | ICD-10-CM | POA: Diagnosis present

## 2020-07-28 LAB — CBC WITH DIFFERENTIAL/PLATELET
Abs Immature Granulocytes: 0.02 10*3/uL (ref 0.00–0.07)
Basophils Absolute: 0 10*3/uL (ref 0.0–0.1)
Basophils Relative: 0 %
Eosinophils Absolute: 0 10*3/uL (ref 0.0–0.5)
Eosinophils Relative: 0 %
HCT: 36.1 % (ref 36.0–46.0)
Hemoglobin: 11.5 g/dL — ABNORMAL LOW (ref 12.0–15.0)
Immature Granulocytes: 0 %
Lymphocytes Relative: 24 %
Lymphs Abs: 1.6 10*3/uL (ref 0.7–4.0)
MCH: 24.9 pg — ABNORMAL LOW (ref 26.0–34.0)
MCHC: 31.9 g/dL (ref 30.0–36.0)
MCV: 78.3 fL — ABNORMAL LOW (ref 80.0–100.0)
Monocytes Absolute: 0.4 10*3/uL (ref 0.1–1.0)
Monocytes Relative: 6 %
Neutro Abs: 4.6 10*3/uL (ref 1.7–7.7)
Neutrophils Relative %: 70 %
Platelets: 236 10*3/uL (ref 150–400)
RBC: 4.61 MIL/uL (ref 3.87–5.11)
RDW: 14.5 % (ref 11.5–15.5)
WBC: 6.5 10*3/uL (ref 4.0–10.5)
nRBC: 0 % (ref 0.0–0.2)

## 2020-07-28 LAB — URINALYSIS, COMPLETE (UACMP) WITH MICROSCOPIC
Bilirubin Urine: NEGATIVE
Glucose, UA: NEGATIVE mg/dL
Ketones, ur: 20 mg/dL — AB
Nitrite: NEGATIVE
Protein, ur: 100 mg/dL — AB
Specific Gravity, Urine: 1.027 (ref 1.005–1.030)
WBC, UA: >50 WBC/hpf — ABNORMAL HIGH (ref 0–5)
pH: 6 (ref 5.0–8.0)

## 2020-07-28 LAB — COMPREHENSIVE METABOLIC PANEL
ALT: 14 U/L (ref 0–44)
AST: 27 U/L (ref 15–41)
Albumin: 3.7 g/dL (ref 3.5–5.0)
Alkaline Phosphatase: 41 U/L (ref 38–126)
Anion gap: 12 (ref 5–15)
BUN: 8 mg/dL (ref 6–20)
CO2: 25 mmol/L (ref 22–32)
Calcium: 8.2 mg/dL — ABNORMAL LOW (ref 8.9–10.3)
Chloride: 100 mmol/L (ref 98–111)
Creatinine, Ser: 1.14 mg/dL — ABNORMAL HIGH (ref 0.44–1.00)
GFR, Estimated: >60 mL/min (ref >60)
Glucose, Bld: 102 mg/dL — ABNORMAL HIGH (ref 70–99)
Potassium: 3.4 mmol/L — ABNORMAL LOW (ref 3.5–5.1)
Sodium: 137 mmol/L (ref 135–145)
Total Bilirubin: 0.6 mg/dL (ref 0.3–1.2)
Total Protein: 8.1 g/dL (ref 6.5–8.1)

## 2020-07-28 LAB — RESPIRATORY PANEL BY RT PCR (FLU A&B, COVID)
Influenza A by PCR: NEGATIVE
Influenza B by PCR: NEGATIVE
SARS Coronavirus 2 by RT PCR: POSITIVE — AB

## 2020-07-28 LAB — POC URINE PREG, ED: Preg Test, Ur: NEGATIVE

## 2020-07-28 MED ORDER — ONDANSETRON 4 MG PO TBDP: 4.0000 mg | ORAL_TABLET | Freq: Three times a day (TID) | ORAL | 0 refills | Status: DC | PRN

## 2020-07-28 MED ORDER — ONDANSETRON HCL 4 MG/2ML IJ SOLN
4.0000 mg | Freq: Once | INTRAMUSCULAR | Status: AC
  Administered 2020-07-28: 4 mg via INTRAVENOUS
  Filled 2020-07-28: qty 2

## 2020-07-28 MED ORDER — ONDANSETRON 4 MG PO TBDP
4.0000 mg | ORAL_TABLET | Freq: Once | ORAL | Status: AC | PRN
  Administered 2020-07-28: 4 mg via ORAL
  Filled 2020-07-28: qty 1

## 2020-07-28 MED ORDER — SODIUM CHLORIDE 0.9 % IV SOLN
1.0000 g | Freq: Once | INTRAVENOUS | Status: AC
  Administered 2020-07-28: 1 g via INTRAVENOUS
  Filled 2020-07-28: qty 10

## 2020-07-28 MED ORDER — ACETAMINOPHEN 325 MG PO TABS
650.0000 mg | ORAL_TABLET | Freq: Once | ORAL | Status: AC
  Administered 2020-07-28: 650 mg via ORAL
  Filled 2020-07-28: qty 2

## 2020-07-28 MED ORDER — SODIUM CHLORIDE 0.9 % IV BOLUS
1000.0000 mL | Freq: Once | INTRAVENOUS | Status: AC
  Administered 2020-07-28: 1000 mL via INTRAVENOUS

## 2020-07-28 MED ORDER — CEFDINIR 300 MG PO CAPS: 300.0000 mg | ORAL_CAPSULE | Freq: Two times a day (BID) | ORAL | 0 refills | Status: DC

## 2020-07-28 NOTE — ED Notes (Signed)
Date and time results received: 07/28/20  (use smartphrase ".now" to insert current time)  Test: Covid Critical Value: positive  Name of Provider Notified: Greig Right  Orders Received? Or Actions Taken?: acknowledged

## 2020-07-28 NOTE — ED Provider Notes (Signed)
[facility] Emergency Department Provider Note  ____________________________________________   First MD Initiated Contact with Patient 07/28/20 1751     (approximate)  I have reviewed the triage vital signs and the nursing notes.   HISTORY  Chief Complaint COVID sx    HPI Brittany Allen is a 34 y.o. female presents emergency department with Covid-like symptoms.  She has had fever, chills, nausea/vomiting and fatigue since Tuesday.  No diarrhea.  States she does not really have abdominal pain but did have sharp pain on the left earlier in the week.  No dysuria.  No vaginal discharge.  No sore throat.    Past Medical History:  Diagnosis Date  . Bacterial vaginitis   . Chlamydia     Patient Active Problem List   Diagnosis Date Noted  . Trichomoniasis 07/30/2017    History reviewed. No pertinent surgical history.  Prior to Admission medications   Medication Sig Start Date End Date Taking? Authorizing Provider  cefdinir (OMNICEF) 300 MG capsule Take 1 capsule (300 mg total) by mouth 2 (two) times daily. 07/28/20   Elizibeth Breau, Roselyn Bering, PA-C  drospirenone-ethinyl estradiol (YAZ) 3-0.02 MG tablet Take 1 tablet by mouth daily. 05/21/20   Copland, Helmut Muster B, PA-C  ondansetron (ZOFRAN-ODT) 4 MG disintegrating tablet Take 1 tablet (4 mg total) by mouth every 8 (eight) hours as needed. 07/28/20   Faythe Ghee, PA-C    Allergies Patient has no known allergies.  Family History  Problem Relation Age of Onset  . Colon cancer Mother 32  . Lymphoma Father 79  . Diabetes Maternal Aunt     Social History Social History   Tobacco Use  . Smoking status: Never Smoker  . Smokeless tobacco: Never Used  Vaping Use  . Vaping Use: Never used  Substance Use Topics  . Alcohol use: Yes    Comment: occ  . Drug use: No    Review of Systems  Constitutional: Positive fever/chills Eyes: No visual changes. ENT: No sore throat. Respiratory: Denies  cough Cardiovascular: Denies chest pain Gastrointestinal: Denies abdominal pain, positive vomiting Genitourinary: Negative for dysuria. Musculoskeletal: Negative for back pain. Skin: Negative for rash. Psychiatric: no mood changes,     ____________________________________________   PHYSICAL EXAM:  VITAL SIGNS: ED Triage Vitals  Enc Vitals Group     BP 07/28/20 1644 (!) 98/55     Pulse Rate 07/28/20 1644 (!) 108     Resp 07/28/20 1644 20     Temp 07/28/20 1644 100.1 F (37.8 C)     Temp Source 07/28/20 1644 Oral     SpO2 07/28/20 1644 96 %     Weight 07/28/20 1645 200 lb (90.7 kg)     Height 07/28/20 1645 5\' 6"  (1.676 m)     Head Circumference --      Peak Flow --      Pain Score 07/28/20 1645 0     Pain Loc --      Pain Edu? --      Excl. in GC? --     Constitutional: Alert and oriented. Well appearing and in no acute distress. Eyes: Conjunctivae are normal.  Head: Atraumatic. Nose: No congestion/rhinnorhea. Mouth/Throat: Mucous membranes are moist.   Neck:  supple no lymphadenopathy noted Cardiovascular: Normal rate, regular rhythm. Heart sounds are normal Respiratory: Normal respiratory effort.  No retractions, lungs c t a  Abd: soft nontender bs normal all 4 quad, no CVA tenderness GU: deferred Musculoskeletal: FROM all extremities, warm and  well perfused Neurologic:  Normal speech and language.  Skin:  Skin is warm, dry and intact. No rash noted. Psychiatric: Mood and affect are normal. Speech and behavior are normal.  ____________________________________________   LABS (all labs ordered are listed, but only abnormal results are displayed)  Labs Reviewed  RESPIRATORY PANEL BY RT PCR (FLU A&B, COVID) - Abnormal; Notable for the following components:      Result Value   SARS Coronavirus 2 by RT PCR POSITIVE (*)    All other components within normal limits  URINALYSIS, COMPLETE (UACMP) WITH MICROSCOPIC - Abnormal; Notable for the following components:    Color, Urine AMBER (*)    APPearance CLOUDY (*)    Hgb urine dipstick MODERATE (*)    Ketones, ur 20 (*)    Protein, ur 100 (*)    Leukocytes,Ua LARGE (*)    WBC, UA >50 (*)    Bacteria, UA FEW (*)    All other components within normal limits  CBC WITH DIFFERENTIAL/PLATELET - Abnormal; Notable for the following components:   Hemoglobin 11.5 (*)    MCV 78.3 (*)    MCH 24.9 (*)    All other components within normal limits  COMPREHENSIVE METABOLIC PANEL - Abnormal; Notable for the following components:   Potassium 3.4 (*)    Glucose, Bld 102 (*)    Creatinine, Ser 1.14 (*)    Calcium 8.2 (*)    All other components within normal limits  POC URINE PREG, ED   ____________________________________________   ____________________________________________  RADIOLOGY    ____________________________________________   PROCEDURES  Procedure(s) performed: Rocephin 1 g IV   Procedures    ____________________________________________   INITIAL IMPRESSION / ASSESSMENT AND PLAN / ED COURSE  Pertinent labs & imaging results that were available during my care of the patient were reviewed by me and considered in my medical decision making (see chart for details).   Patient is 34 year old female presents emergency department with fever, chills and vomiting. She denies any back pain or abdominal pain. No diarrhea. No known exposure to Covid. The patient does appear to be stable with a low-grade temp. Heart rates a little elevated and I think this is mainly due to the vomiting. Will do labs to ensure she is not dehydrated or has a metabolic abnormality.  DDx: UTI, pyelonephritis, Covid, gastroenteritis  CBC is basically normal, comprehensive metabolic panel has decreased potassium at 3.4, creatinine is a little elevated mostly due to her dehydration, urinalysis has a large amount of white cells greater than 50 WBCs and few bacteria, POC pregnancy is negative, Covid test is positive  I did  discuss the findings with the patient and her mother who unfortunately had been let into her room.  Patient may be on approximately day 7 of the initial infection.  We will do a chest x-ray to ensure she does not have a multifocal pneumonia.  Patient is also to quarantine for 14 days.  She will have to notify her employer.  However I do want her to finish the antibiotic for the kidney infection.  Chest x-ray does show Covid opacities.  With the patient being unvaccinated I did send her chart to the [facility] infusion clinic via secure chat.  It was received by a NP who states they are adding her to their list.  I did explain this to the patient that if she has not heard from them by tomorrow afternoon that she should call the phone number herself.  She was discharged  in stable condition in the care of her mother.     Brittany Allen was evaluated in Emergency Department on 07/28/2020 for the symptoms described in the history of present illness. She was evaluated in the context of the global COVID-19 pandemic, which necessitated consideration that the patient might be at risk for infection with the SARS-CoV-2 virus that causes COVID-19. Institutional protocols and algorithms that pertain to the evaluation of patients at risk for COVID-19 are in a state of rapid change based on information released by regulatory bodies including the CDC and federal and state organizations. These policies and algorithms were followed during the patient's care in the ED.    As part of my medical decision making, I reviewed the following data within the electronic MEDICAL RECORD NUMBER History obtained from family, Nursing notes reviewed and incorporated, Labs reviewed , Old chart reviewed, Radiograph reviewed , Notes from prior ED visits and [place] Controlled Substance Database  ____________________________________________   FINAL CLINICAL IMPRESSION(S) / ED DIAGNOSES  Final diagnoses:  COVID-19  Pyelonephritis      NEW  MEDICATIONS STARTED DURING THIS VISIT:  New Prescriptions   CEFDINIR (OMNICEF) 300 MG CAPSULE    Take 1 capsule (300 mg total) by mouth 2 (two) times daily.   ONDANSETRON (ZOFRAN-ODT) 4 MG DISINTEGRATING TABLET    Take 1 tablet (4 mg total) by mouth every 8 (eight) hours as needed.     Note:  This document was prepared using Dragon voice recognition software and may include unintentional dictation errors.    Faythe Ghee, PA-C 07/28/20 Marlou Starks, MD 07/28/20 2352

## 2020-07-28 NOTE — Discharge Instructions (Addendum)
Follow-up with the infusion clinic.  If they have not called you by tomorrow afternoon please call them.  Return to the emergency department if worsening.  Take the medications as prescribed.

## 2020-07-28 NOTE — ED Triage Notes (Signed)
Pt to ED for nausea, vomiting, chills, fatigue since Tuesday.  RR even and unlabored, NAD noted, ambulatory.

## 2020-07-29 ENCOUNTER — Telehealth: Payer: Self-pay | Admitting: Physician Assistant

## 2020-07-29 NOTE — Telephone Encounter (Signed)
Called to Discuss with patient about Covid symptoms and the use of the monoclonal antibody infusion for those with mild to moderate Covid symptoms and at a high risk of hospitalization.     Pt appears to qualify for this infusion due to co-morbid conditions and/or a member of an at-risk group in accordance with the FDA Emergency Use Authorization.    I was able to speak with the patient about [facility]. She states she does not feel that sick, but does have fever, chills, shortness of breath, N/V, diarrhea, and dehydration. I sent information to her MyChart account to review. She will call the hotline if she is interested in scheduling.  Sx onset 07/24/20 Positive test in Epic 07/28/20.  Qualifies for BMI, ?SVI

## 2020-07-30 ENCOUNTER — Ambulatory Visit (HOSPITAL_COMMUNITY): Payer: Self-pay

## 2020-07-30 ENCOUNTER — Telehealth: Payer: Self-pay | Admitting: Physician Assistant

## 2020-07-30 ENCOUNTER — Encounter: Payer: Self-pay | Admitting: Physician Assistant

## 2020-07-30 DIAGNOSIS — Z6832 Body mass index (BMI) 32.0-32.9, adult: Secondary | ICD-10-CM | POA: Insufficient documentation

## 2020-07-30 NOTE — Telephone Encounter (Signed)
Called to discuss with [place] Caster about Covid symptoms and the use of casirivimab/imdevimab or bamlanivimab/etesevimab infusion for those with mild to moderate Covid symptoms and at a high risk of hospitalization.     Pt is qualified for this infusion at the monoclonal antibody infusion center due to co-morbid conditions and/or a member of an at-risk group, however declines infusion at this time. Symptoms tier reviewed as well as criteria for ending isolation.  Symptoms reviewed that would warrant ED/Hospital evaluation. Preventative practices reviewed. Patient verbalized understanding. Patient advised to call back if he decides that he does want to get infusion. Callback number to the infusion center given. Patient advised to go to Urgent care or ED with severe symptoms. Last date pt would be eligible for infusion is 10/19.    Patient Active Problem List   Diagnosis Date Noted  . BMI 32.0-32.9,adult   . Trichomoniasis 07/30/2017    Cline Crock PA-C

## 2021-12-03 IMAGING — DX DG CHEST 1V PORT
1 series · 1 of 1 positions shown · non-contrast
Comparison: None.

CLINICAL DATA: Cough and fevers, 25BMN-FQ positivity

EXAM:
PORTABLE CHEST 1 VIEW

[chest ap]
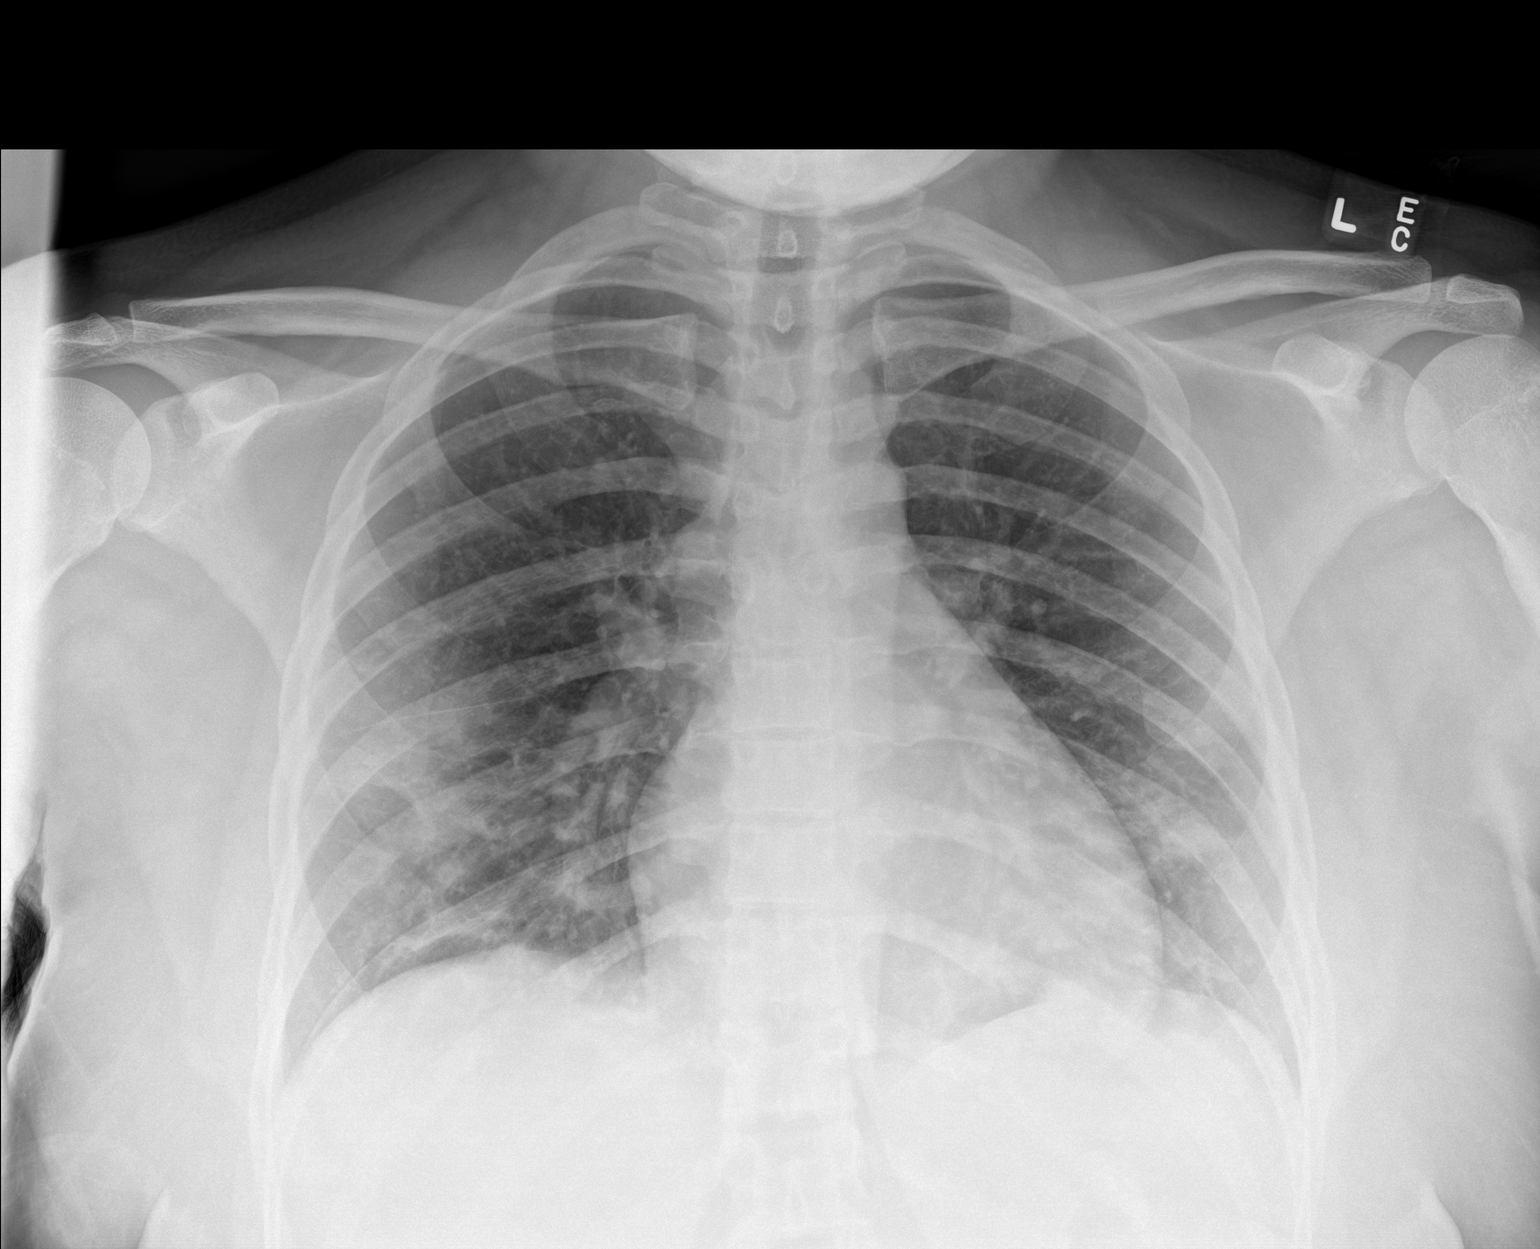

[1 of 1 positions shown; findings below may reference images not displayed]

FINDINGS: Cardiac shadow is within normal limits. The lungs are well aerated
bilaterally with patchy airspace opacities predominately in the
bases consistent with the given clinical history. No bony
abnormality is seen.
IMPRESSION: Patchy airspace opacity consistent with the given clinical history
of 25BMN-FQ.

## 2023-07-19 NOTE — Progress Notes (Unsigned)
PCP:  Pcp, No   No chief complaint on file.    HPI:      [name] is a 37 y.o. G1P1001 whose LMP was No LMP recorded., presents today for her NP> 3 yrs annual examination.  Her menses are regular every 28-30 days, lasting 4-5 days, mod flow.  Dysmenorrhea none. She does not have intermenstrual bleeding.  Sex activity: single partner, contraception - condoms. Was on OCPs until a few months ago; stopped due to insurance coverage. Would like to restart. No hx of HTN, DVTs, migraines with aura.  Last Pap: 07/26/17 Results were: no abnormalities /neg HPV DNA   There is no FH of breast cancer. There is no FH of ovarian cancer. The patient does not do self-breast exams. She has a FH of colon cancer in her mom age 84. Doesn't qualify for cancer genetic testing but should start colonoscopies age 54.  Tobacco use: The patient denies current or previous tobacco use. Alcohol use: none No drug use.  Exercise: moderately active  She does get adequate calcium but not Vitamin D in her diet.     The pregnancy intention screening data noted above was reviewed. Potential methods of contraception were discussed. The patient elected to proceed with Oral Contraceptive.    Past Medical History:  Diagnosis Date   Bacterial vaginitis    BMI 32.0-32.9,adult    Chlamydia     No past surgical history on file.  Family History  Problem Relation Age of Onset   Colon cancer Mother 34   Lymphoma Father 43   Diabetes Maternal Aunt     Social History   Socioeconomic History   Marital status: Single    Spouse name: Not on file   Number of children: Not on file   Years of education: Not on file   Highest education level: Not on file  Occupational History   Not on file  Tobacco Use   Smoking status: Never   Smokeless tobacco: Never  Vaping Use   Vaping status: Never Used  Substance and Sexual Activity   Alcohol use: Yes    Comment: occ   Drug use: No   Sexual activity: Yes     Partners: Male    Birth control/protection: None  Other Topics Concern   Not on file  Social History Narrative   Not on file   Social Determinants of Health   Financial Resource Strain: Not on file  Food Insecurity: Not on file  Transportation Needs: Not on file  Physical Activity: Not on file  Stress: Not on file  Social Connections: Not on file  Intimate Partner Violence: Not on file     Current Outpatient Medications:    cefdinir (OMNICEF) 300 MG capsule, Take 1 capsule (300 mg total) by mouth 2 (two) times daily., Disp: 20 capsule, Rfl: 0   drospirenone-ethinyl estradiol (YAZ) 3-0.02 MG tablet, Take 1 tablet by mouth daily., Disp: 84 tablet, Rfl: 3   ondansetron (ZOFRAN-ODT) 4 MG disintegrating tablet, Take 1 tablet (4 mg total) by mouth every 8 (eight) hours as needed., Disp: 20 tablet, Rfl: 0     ROS:  Review of Systems  Constitutional:  Negative for fatigue, fever and unexpected weight change.  Respiratory:  Negative for cough, shortness of breath and wheezing.   Cardiovascular:  Negative for chest pain, palpitations and leg swelling.  Gastrointestinal:  Negative for blood in stool, constipation, diarrhea, nausea and vomiting.  Endocrine: Negative for cold intolerance, heat intolerance and  polyuria.  Genitourinary:  Negative for dyspareunia, dysuria, flank pain, frequency, genital sores, hematuria, menstrual problem, pelvic pain, urgency, vaginal bleeding, vaginal discharge and vaginal pain.  Musculoskeletal:  Negative for back pain, joint swelling and myalgias.  Skin:  Negative for rash.  Neurological:  Negative for dizziness, syncope, light-headedness, numbness and headaches.  Hematological:  Negative for adenopathy.  Psychiatric/Behavioral:  Negative for agitation, confusion, sleep disturbance and suicidal ideas. The patient is not nervous/anxious.   BREAST: No symptoms   Objective: There were no vitals taken for this visit.   Physical Exam Constitutional:       Appearance: She is well-developed.  Genitourinary:     Vulva normal.     No vaginal discharge, erythema or tenderness.      Right Adnexa: not tender and no mass present.    Left Adnexa: not tender and no mass present.    No cervical polyp.     Uterus is not enlarged or tender.  Breasts:    Right: No mass, nipple discharge, skin change or tenderness.     Left: No mass, nipple discharge, skin change or tenderness.  Neck:     Thyroid: No thyromegaly.  Cardiovascular:     Rate and Rhythm: Normal rate and regular rhythm.     Heart sounds: Normal heart sounds. No murmur heard. Pulmonary:     Effort: Pulmonary effort is normal.     Breath sounds: Normal breath sounds.  Abdominal:     Palpations: Abdomen is soft.     Tenderness: There is no abdominal tenderness. There is no guarding.  Musculoskeletal:        General: Normal range of motion.     Cervical back: Normal range of motion.  Neurological:     General: No focal deficit present.     Mental Status: She is alert and oriented to person, place, and time.     Cranial Nerves: No cranial nerve deficit.  Skin:    General: Skin is warm and dry.  Psychiatric:        Mood and Affect: Mood normal.        Behavior: Behavior normal.        Thought Content: Thought content normal.        Judgment: Judgment normal.  Vitals reviewed.     Assessment/Plan: Encounter for annual routine gynecological examination  Encounter for initial prescription of contraceptive pills - Plan: drospirenone-ethinyl estradiol (YAZ) 3-0.02 MG tablet; OCP start today after neg UPT or with next menses. Condoms. Rx eRxd. Will change if there is ins coverage. F/u prn.   No orders of the defined types were placed in this encounter.            GYN counsel adequate intake of calcium and vitamin D, diet and exercise     F/U  No follow-ups on file.  Soleil Mas B. Amauria Younts, PA-C 07/19/2023 4:42 PM

## 2023-07-20 ENCOUNTER — Ambulatory Visit: Payer: Self-pay | Admitting: Obstetrics and Gynecology

## 2023-07-20 ENCOUNTER — Encounter: Payer: Self-pay | Admitting: Obstetrics and Gynecology

## 2023-07-20 ENCOUNTER — Other Ambulatory Visit (HOSPITAL_COMMUNITY)
Admission: RE | Admit: 2023-07-20 | Discharge: 2023-07-20 | Disposition: A | Discharge status: Home / Self Care | Payer: BLUE CROSS/BLUE SHIELD | Source: Ambulatory Visit | Attending: Obstetrics and Gynecology | Admitting: Obstetrics and Gynecology

## 2023-07-20 VITALS — BP 122/76 | Ht 65.0 in | Wt 221.0 lb

## 2023-07-20 DIAGNOSIS — Z01419 Encounter for gynecological examination (general) (routine) without abnormal findings: Secondary | ICD-10-CM

## 2023-07-20 DIAGNOSIS — Z1151 Encounter for screening for human papillomavirus (HPV): Secondary | ICD-10-CM | POA: Diagnosis not present

## 2023-07-20 DIAGNOSIS — Z124 Encounter for screening for malignant neoplasm of cervix: Secondary | ICD-10-CM

## 2023-07-20 DIAGNOSIS — Z30011 Encounter for initial prescription of contraceptive pills: Secondary | ICD-10-CM

## 2023-07-20 MED ORDER — DROSPIRENONE-ETHINYL ESTRADIOL 3-0.02 MG PO TABS: 1.0000 | ORAL_TABLET | Freq: Every day | ORAL | 3 refills | Status: AC

## 2023-07-20 NOTE — Patient Instructions (Signed)
I value your feedback and you entrusting us with your care. If you get a [facility] patient survey, I would appreciate you taking the time to let us know about your experience today. Thank you! ? ? ?

## 2023-07-22 LAB — CYTOLOGY - PAP
Comment: NEGATIVE
Diagnosis: UNDETERMINED — AB
High risk HPV: NEGATIVE

## 2023-11-22 ENCOUNTER — Ambulatory Visit: Payer: Self-pay

## 2023-11-22 ENCOUNTER — Telehealth: Payer: Self-pay

## 2023-11-22 NOTE — Telephone Encounter (Signed)
 Called [place] and left voicemail

## 2023-11-22 NOTE — Telephone Encounter (Signed)
 Brittany Allen called into triage stating she thinks she has a UTI but had some additional questions to ask before scheduling.

## 2023-11-24 ENCOUNTER — Ambulatory Visit: Payer: Self-pay

## 2024-03-02 ENCOUNTER — Ambulatory Visit (INDEPENDENT_AMBULATORY_CARE_PROVIDER_SITE_OTHER)

## 2024-03-02 VITALS — BP 109/63 | Temp 99.1°F | Resp 16 | Ht 65.0 in | Wt 223.3 lb

## 2024-03-02 DIAGNOSIS — R350 Frequency of micturition: Secondary | ICD-10-CM | POA: Diagnosis not present

## 2024-03-02 LAB — POCT URINALYSIS DIPSTICK
Bilirubin, UA: NEGATIVE
Blood, UA: POSITIVE
Glucose, UA: NEGATIVE
Ketones, UA: NEGATIVE
Nitrite, UA: NEGATIVE
Protein, UA: POSITIVE — AB
Spec Grav, UA: 1.025 (ref 1.010–1.025)
Urobilinogen, UA: 0.2 U/dL
pH, UA: 6 (ref 5.0–8.0)

## 2024-03-02 MED ORDER — NITROFURANTOIN MONOHYD MACRO 100 MG PO CAPS: 100.0000 mg | ORAL_CAPSULE | Freq: Two times a day (BID) | ORAL | 0 refills | Status: AC

## 2024-03-02 NOTE — Progress Notes (Signed)
    NURSE VISIT NOTE  Subjective:    Patient ID: Brittany Allen, female    DOB: 1986-09-20, 38 y.o.   MRN: [id number]       HPI  Patient is a 38 y.o. G69P1001 female who presents for urinary frequency, urinary urgency, and cloudy malordorous urine for 1 month.  Patient denies dysuria, abdominal pain, and pelvic pain.  Patient does not have a history of recurrent UTI.  Patient does not have a history of pyelonephritis.    Objective:    Resp 16   Ht 5\' 5"  (1.651 m)   Wt 223 lb 4.8 oz (101.3 kg)   BMI 37.16 kg/m    Lab Review  No results found for any visits on 03/02/24.  Assessment:   1. Urinary frequency      Plan:   Treatment  Macrobid  100 mg PO BID for 7 days.   Doneen Fuelling, CMA [facility]

## 2024-03-02 NOTE — Patient Instructions (Signed)

## 2024-03-05 ENCOUNTER — Ambulatory Visit: Payer: Self-pay | Admitting: Obstetrics and Gynecology

## 2024-03-05 LAB — URINE CULTURE

## 2024-05-29 ENCOUNTER — Ambulatory Visit (INDEPENDENT_AMBULATORY_CARE_PROVIDER_SITE_OTHER)

## 2024-05-29 VITALS — BP 100/67 | HR 85 | Ht 65.0 in | Wt 225.1 lb

## 2024-05-29 DIAGNOSIS — N3001 Acute cystitis with hematuria: Secondary | ICD-10-CM

## 2024-05-29 LAB — POCT URINALYSIS DIPSTICK
Appearance: ABNORMAL
Bilirubin, UA: NEGATIVE
Glucose, UA: NEGATIVE
Ketones, UA: NEGATIVE
Nitrite, UA: NEGATIVE
Odor: NORMAL
Protein, UA: NEGATIVE
Spec Grav, UA: 1.02 (ref 1.010–1.025)
Urobilinogen, UA: 0.2 U/dL
pH, UA: 5 (ref 5.0–8.0)

## 2024-05-29 MED ORDER — NITROFURANTOIN MONOHYD MACRO 100 MG PO CAPS: 100.0000 mg | ORAL_CAPSULE | Freq: Two times a day (BID) | ORAL | 0 refills | Status: DC

## 2024-05-29 NOTE — Progress Notes (Signed)
    NURSE VISIT NOTE  Subjective:    Patient ID: Brittany Allen, female    DOB: 03-13-1986, 38 y.o.   MRN: [id number]       HPI  Patient is a 38 y.o. G55P1001 female who presents for dysuria, hematuria, and genital irritation for 2 weeks.  Patient denies urinary frequency, urinary urgency, flank pain, abdominal pain, pelvic pain, cloudy malordorous urine, genital rash, and vaginal discharge.  Patient does not have a history of recurrent UTI.  Patient does not have a history of pyelonephritis.    Objective:    BP 100/67   Pulse 85   Ht 5' 5 (1.651 m)   Wt 225 lb 1.6 oz (102.1 kg)   LMP 05/20/2024   BMI 37.46 kg/m    Lab Review  No results found for any visits on 05/29/24.  Assessment:   1. Acute cystitis with hematuria      Plan:   Urine Culture Sent. Treatment  Macrobid  100 mg PO BID for 7 days. Maintain adequate hydration.  May use AZO OTC prn.  Follow up if symptoms worsen or fail to improve as anticipated, and as needed.  Check my chart for results.     Camelia Bars, LPN

## 2024-05-29 NOTE — Patient Instructions (Signed)
 HEALTHY VAGINAL HYGIENE  AVOID   Panytyhose Synthetic underwear (wear COTTON underwear)  Tight pants/jeans Thongs Pantyliners Scented soaps/shower gels (use Dove Sensitive Skin soap or water to clean) Bubble bath/bath bombs Scented detergents  ALL dryer sheets (line dry underwear if using them on your other clothing) Feminine sprays/douches   FOR RECURRENT BACTERIAL VAGINOSIS (BV) Above recommendations and ADD probiotics daily, USE CONDOMS  Visit www.[email or web address].com    Urinary Tract Infection, Female A urinary tract infection (UTI) is an infection in your urinary tract. The urinary tract is made up of organs that make, store, and get rid of pee (urine) in your body. These organs include: The kidneys. The ureters. The bladder. The urethra. What are the causes? Most UTIs are caused by germs called bacteria. They may be in or near your genitals. These germs grow and cause swelling in your urinary tract. What increases the risk? You're more likely to get a UTI if: You're a female. The urethra is shorter in females than in males. You have a soft tube called a catheter that drains your pee. You can't control when you pee or poop. You have trouble peeing because of: A kidney stone. A urinary blockage. A nerve condition that affects your bladder. Not getting enough to drink. You're sexually active. You use a birth control inside your vagina, like spermicide. You're pregnant. You have low levels of the hormone estrogen in your body. You're an older adult. You're also more likely to get a UTI if you have other health problems. These may include: Diabetes. A weak immune system. Your immune system is your body's defense system. Sickle cell disease. Injury of the spine. What are the signs or symptoms? Symptoms may include: Needing to pee right away. Peeing small amounts often. Pain or burning when you pee. Blood in your pee. Pee that smells bad or odd. Pain in your  belly or lower back. You may also: Feel confused. This may be the first symptom in older adults. Vomit. Not feel hungry. Feel tired or easily annoyed. Have a fever or chills. How is this diagnosed? A UTI is diagnosed based on your medical history and an exam. You may also have other tests. These may include: Pee tests. Blood tests. Tests for sexually transmitted infections (STIs). If you've had more than one UTI, you may need to have imaging studies done to find out why you keep getting them. How is this treated? A UTI can be treated by: Taking antibiotics or other medicines. Drinking enough fluid to keep your pee pale yellow. In rare cases, a UTI can cause a very bad condition called sepsis. Sepsis may be treated in the hospital. Follow these instructions at home: Medicines Take your medicines only as told by your health care provider. If you were given antibiotics, take them as told by your provider. Do not stop taking them even if you start to feel better. General instructions Make sure you: Pee often and fully. Do not hold your pee for a long time. Wipe from front to back after you pee or poop. Use each tissue only once when you wipe. Pee after you have sex. Do not douche or use sprays or powders in your genital area. Contact a health care provider if: Your symptoms don't get better after 1-2 days of taking antibiotics. Your symptoms go away and then come back. You have a fever or chills. You vomit or feel like you may vomit. Get help right away if: You have very bad pain in  your back or lower belly. You faint. This information is not intended to replace advice given to you by your health care provider. Make sure you discuss any questions you have with your health care provider. Document Revised: 09/08/2023 Document Reviewed: 01/01/2023 Elsevier Patient Education  2025 [employer].

## 2024-05-31 ENCOUNTER — Ambulatory Visit: Payer: Self-pay | Admitting: Obstetrics and Gynecology

## 2024-05-31 LAB — URINE CULTURE

## 2024-06-01 ENCOUNTER — Encounter: Payer: Self-pay | Admitting: Obstetrics and Gynecology

## 2024-06-01 ENCOUNTER — Ambulatory Visit (INDEPENDENT_AMBULATORY_CARE_PROVIDER_SITE_OTHER): Admitting: Obstetrics and Gynecology

## 2024-06-01 VITALS — BP 110/74 | HR 64 | Ht 65.0 in | Wt 225.0 lb

## 2024-06-01 DIAGNOSIS — N898 Other specified noninflammatory disorders of vagina: Secondary | ICD-10-CM | POA: Diagnosis not present

## 2024-06-01 DIAGNOSIS — Z113 Encounter for screening for infections with a predominantly sexual mode of transmission: Secondary | ICD-10-CM

## 2024-06-01 LAB — POCT WET PREP WITH KOH
Clue Cells Wet Prep HPF POC: NEGATIVE
KOH Prep POC: NEGATIVE
Trichomonas, UA: NEGATIVE
Yeast Wet Prep HPF POC: NEGATIVE

## 2024-06-01 NOTE — Progress Notes (Signed)
 Pcp, No   Chief Complaint  Patient presents with   Vaginal Irritation    States putting a tampon in when she was on her cycle and felt like something was scratching? Now it just feels irritated.    HPI:      Brittany Allen is a 38 y.o. G1P1001 whose LMP was Patient's last menstrual period was 05/20/2024 (exact date)., presents today for vaginal irritation for the past 1 1/2 wks, no increased d/c or odor. Sx started after placing a tampon and pt states it felt scratchy, so she removed it. Had sex a few days later and had some discomfort with insertion. No urin sx, LBP, pelvic pain, fevers. Does have a new sexual partner. No meds to treat, no new soaps/detergents. Uses dove sens skin soap, dryer sheets and cotton underwear. No prior abx use.  Thought had UTI sx so came for UA 05/29/24 and started on macrobid  due to pos leuks and RBCs on UA. C&S was negative; pt hasn't taken abx yet today.  No on BC for a few months. Has some BTB with cycles, plans to restart OCPs.   Patient Active Problem List   Diagnosis Date Noted   BMI 32.0-32.9,adult    Trichomoniasis 07/30/2017    Past Surgical History:  Procedure Laterality Date   WISDOM TOOTH EXTRACTION      Family History  Problem Relation Age of Onset   Colon cancer Mother 83   Lymphoma Father 32   Diabetes Maternal Aunt     Social History   Socioeconomic History   Marital status: Single    Spouse name: Not on file   Number of children: Not on file   Years of education: Not on file   Highest education level: Not on file  Occupational History   Not on file  Tobacco Use   Smoking status: Never   Smokeless tobacco: Never  Vaping Use   Vaping status: Never Used  Substance and Sexual Activity   Alcohol use: Yes    Comment: occ   Drug use: No   Sexual activity: Yes    Partners: Male    Birth control/protection: None, Condom  Other Topics Concern   Not on file  Social History Narrative   Not on file   Social  Drivers of Health   Financial Resource Strain: Not on file  Food Insecurity: Not on file  Transportation Needs: Not on file  Physical Activity: Not on file  Stress: Not on file  Social Connections: Not on file  Intimate Partner Violence: Not on file    Outpatient Medications Prior to Visit  Medication Sig Dispense Refill   nitrofurantoin , macrocrystal-monohydrate, (MACROBID ) 100 MG capsule Take 1 capsule (100 mg total) by mouth 2 (two) times daily. 14 capsule 0   drospirenone -ethinyl estradiol  (YAZ) 3-0.02 MG tablet Take 1 tablet by mouth daily. (Patient not taking: Reported on 06/01/2024) 84 tablet 3   No facility-administered medications prior to visit.      ROS:  Review of Systems  Constitutional:  Negative for fever.  Gastrointestinal:  Negative for blood in stool, constipation, diarrhea, nausea and vomiting.  Genitourinary:  Negative for dyspareunia, dysuria, flank pain, frequency, hematuria, urgency, vaginal bleeding, vaginal discharge and vaginal pain.  Musculoskeletal:  Negative for back pain.  Skin:  Negative for rash.   BREAST: No symptoms   OBJECTIVE:   Vitals:  BP 110/74   Pulse 64   Ht 5' 5 (1.651 m)   Wt 225 lb (  102.1 kg)   LMP 05/20/2024 (Exact Date)   BMI 37.44 kg/m   Physical Exam Vitals reviewed.  Constitutional:      Appearance: She is well-developed.  Pulmonary:     Effort: Pulmonary effort is normal.  Genitourinary:    General: Normal vulva.     Pubic Area: No rash.      Labia:        Right: No rash, tenderness or lesion.        Left: No rash, tenderness or lesion.      Vagina: Vaginal discharge present. No erythema or tenderness.     Cervix: Normal.     Uterus: Normal. Not enlarged and not tender.      Adnexa: Right adnexa normal and left adnexa normal.       Right: No mass or tenderness.         Left: No mass or tenderness.       Comments: SLIGHTLY IRRITATION BELOW CLITORIS; NEG EXAM EXCEPT WHITE D/C WITH BLOOD  TINGE Musculoskeletal:        General: Normal range of motion.     Cervical back: Normal range of motion.  Skin:    General: Skin is warm and dry.  Neurological:     General: No focal deficit present.     Mental Status: She is alert and oriented to person, place, and time.  Psychiatric:        Mood and Affect: Mood normal.        Behavior: Behavior normal.        Thought Content: Thought content normal.        Judgment: Judgment normal.     Results: Results for orders placed or performed in visit on 06/01/24 (from the past 24 hours)  POCT Wet Prep with KOH     Status: Normal   Collection Time: 06/01/24  2:30 PM  Result Value Ref Range   Trichomonas, UA Negative    Clue Cells Wet Prep HPF POC neg    Epithelial Wet Prep HPF POC     Yeast Wet Prep HPF POC neg    Bacteria Wet Prep HPF POC     RBC Wet Prep HPF POC     WBC Wet Prep HPF POC     KOH Prep POC Negative Negative     Assessment/Plan: Vaginal irritation - Plan: NuSwab Vaginitis Plus (VG+), POCT Wet Prep with KOH; neg wet prep and exam except vag d/c, rule out STDs/check culture. Will f/u with results.   Screening for STD (sexually transmitted disease) - Plan: NuSwab Vaginitis Plus (VG+)  Restart OCPs with menses (has Rx from 10/24 annual)/condoms for 7 days   Return if symptoms worsen or fail to improve.  Jessup Ogas B. Edris Friedt, PA-C 06/01/2024 2:31 PM

## 2024-06-01 NOTE — Telephone Encounter (Signed)
 The patient is scheduled for 8/21 with Brittany Allen

## 2024-06-01 NOTE — Patient Instructions (Signed)
 I value your feedback and you entrusting [place] with your care. If you get a [facility] patient survey, I would appreciate you taking the time to let us know about your experience today. Thank you! ? ? ?

## 2024-06-05 ENCOUNTER — Ambulatory Visit: Payer: Self-pay | Admitting: Obstetrics and Gynecology

## 2024-06-05 LAB — NUSWAB VAGINITIS PLUS (VG+)
Candida albicans, NAA: NEGATIVE
Candida glabrata, NAA: NEGATIVE
Chlamydia trachomatis, NAA: NEGATIVE
Neisseria gonorrhoeae, NAA: NEGATIVE
Trich vag by NAA: POSITIVE — AB

## 2024-06-05 MED ORDER — METRONIDAZOLE 500 MG PO TABS: ORAL_TABLET | ORAL | 0 refills | Status: DC

## 2024-06-05 NOTE — Addendum Note (Signed)
 Addended by: WATT BERNARDA NOVAK on: 06/05/2024 08:27 PM   Modules accepted: Orders

## 2024-07-12 ENCOUNTER — Emergency Department
Admission: EM | Admit: 2024-07-12 | Discharge: 2024-07-12 | Disposition: A | Discharge status: Home / Self Care | Source: Ambulatory Visit | Attending: Emergency Medicine | Admitting: Emergency Medicine

## 2024-07-12 ENCOUNTER — Other Ambulatory Visit: Payer: Self-pay

## 2024-07-12 ENCOUNTER — Emergency Department

## 2024-07-12 DIAGNOSIS — R079 Chest pain, unspecified: Secondary | ICD-10-CM

## 2024-07-12 DIAGNOSIS — K219 Gastro-esophageal reflux disease without esophagitis: Secondary | ICD-10-CM | POA: Diagnosis not present

## 2024-07-12 LAB — BASIC METABOLIC PANEL WITH GFR
Anion gap: 11 (ref 5–15)
BUN: 13 mg/dL (ref 6–20)
CO2: 24 mmol/L (ref 22–32)
Calcium: 8.9 mg/dL (ref 8.9–10.3)
Chloride: 106 mmol/L (ref 98–111)
Creatinine, Ser: 1.02 mg/dL — ABNORMAL HIGH (ref 0.44–1.00)
GFR, Estimated: >60 mL/min (ref >60)
Glucose, Bld: 92 mg/dL (ref 70–99)
Potassium: 3.8 mmol/L (ref 3.5–5.1)
Sodium: 141 mmol/L (ref 135–145)

## 2024-07-12 LAB — CBC
HCT: 30.7 % — ABNORMAL LOW (ref 36.0–46.0)
Hemoglobin: 9.4 g/dL — ABNORMAL LOW (ref 12.0–15.0)
MCH: 22.8 pg — ABNORMAL LOW (ref 26.0–34.0)
MCHC: 30.6 g/dL (ref 30.0–36.0)
MCV: 74.5 fL — ABNORMAL LOW (ref 80.0–100.0)
Platelets: 391 K/uL (ref 150–400)
RBC: 4.12 MIL/uL (ref 3.87–5.11)
RDW: 16.8 % — ABNORMAL HIGH (ref 11.5–15.5)
WBC: 10.3 K/uL (ref 4.0–10.5)
nRBC: 0 % (ref 0.0–0.2)

## 2024-07-12 LAB — LIPASE, BLOOD: Lipase: 41 U/L (ref 11–51)

## 2024-07-12 LAB — TROPONIN I (HIGH SENSITIVITY): Troponin I (High Sensitivity): <2 ng/L (ref <18)

## 2024-07-12 NOTE — ED Triage Notes (Signed)
 Patient sent over from [facility] for upper chest pain since this AM.

## 2024-07-12 NOTE — Discharge Instructions (Addendum)
 Your exam, labs, chest x-ray, and EKG are all normal and reassuring at this time.  No signs of a serious underlying cardiac or pulmonary source for your pain.  Your symptoms may be due to your poorly controlled reflux disease.  Increase your omeprazole to 40 mg daily.  Select and follow-up with a local community provider for routine medical care.  Please go to the following website to schedule new (and existing) patient appointments:   [email or web address]   The following is a list of primary care offices in the area who are accepting new patients at this time.  Please reach out to one of them directly and let them know you would like to schedule an appointment to follow up on an Emergency Department visit, and/or to establish a new primary care provider (PCP).  There are likely other primary care clinics in the are who are accepting new patients, but this is an excellent place to start:  [facility] Lead physician: Dr Jon Eva [address] [place], [place] [address] [phone]  [facility] Lead Physician: Dr Dorette Loron [address], [place], [place] [address] [phone]  [facility]  Lead Physician: Dr Duwaine Louder [address] [place], [place] [address] [phone]  [facility] Lead Physician: Dr Marolyn Officer [address] [place], [place] [address] [phone]  [facility] Medicine at [facility] Lead Physician: Dr Leita Adie [address] [place], [place], [place] [address] [phone]

## 2024-07-12 NOTE — ED Provider Notes (Signed)
 [facility] Emergency Department Provider Note     Event Date/Time   First MD Initiated Contact with Patient 07/12/24 1549     (approximate)   History   Chest Pain   HPI  Brittany Allen is a 38 y.o. female with a medical history that includes GERD, presents to the ED endorsing upper chest pain.  She reports onset of symptoms this morning.  She presents to the ED from [facility] for evaluation of epigastric chest pain.  Patient denies any nausea, vomiting, diaphoresis, or shortness of breath.  Physical Exam   Triage Vital Signs: ED Triage Vitals  Encounter Vitals Group     BP 07/12/24 1412 118/60     Girls Systolic BP Percentile --      Girls Diastolic BP Percentile --      Boys Systolic BP Percentile --      Boys Diastolic BP Percentile --      Pulse Rate 07/12/24 1412 76     Resp 07/12/24 1412 18     Temp 07/12/24 1412 98.8 F (37.1 C)     Temp Source 07/12/24 1412 Oral     SpO2 07/12/24 1412 100 %     Weight 07/12/24 1409 220 lb (99.8 kg)     Height 07/12/24 1409 5' 5 (1.651 m)     Head Circumference --      Peak Flow --      Pain Score 07/12/24 1409 0     Pain Loc --      Pain Education --      Exclude from Growth Chart --     Most recent vital signs: Vitals:   07/12/24 1412 07/12/24 1804  BP: 118/60 115/81  Pulse: 76 68  Resp: 18 16  Temp: 98.8 F (37.1 C) 98.3 F (36.8 C)  SpO2: 100% 100%    General Awake, no distress. NAD HEENT NCAT. PERRL. EOMI. No rhinorrhea. Mucous membranes are moist.  CV:  Good peripheral perfusion. RRR RESP:  Normal effort. CTA ABD:  No distention.    ED Results / Procedures / Treatments   Labs (all labs ordered are listed, but only abnormal results are displayed) Labs Reviewed  BASIC METABOLIC PANEL WITH GFR - Abnormal; Notable for the following components:      Result Value   Creatinine, Ser 1.02 (*)    All other components within normal limits  CBC - Abnormal; Notable for the following  components:   Hemoglobin 9.4 (*)    HCT 30.7 (*)    MCV 74.5 (*)    MCH 22.8 (*)    RDW 16.8 (*)    All other components within normal limits  LIPASE, BLOOD  TROPONIN I (HIGH SENSITIVITY)    EKG  Vent. rate 75 BPM PR interval 138 ms QRS duration 88 ms QT/QTcB 388/433 ms P-R-T axes 71 64 29 Normal sinus rhythm Normal ECG No previous ECGs available  RADIOLOGY  I personally viewed and evaluated these images as part of my medical decision making, as well as reviewing the written report by the radiologist.  ED Provider Interpretation: no acute findings  DG Chest 2 View Result Date: 07/12/2024 CLINICAL DATA:  Chest pain. EXAM: CHEST - 2 VIEW COMPARISON:  July 28, 2020. FINDINGS: The heart size and mediastinal contours are within normal limits. Both lungs are clear. The visualized skeletal structures are unremarkable. IMPRESSION: No active cardiopulmonary disease. Electronically Signed   By: Lynwood Landy Raddle M.D.   On: 07/12/2024  14:54     PROCEDURES:  Critical Care performed: No  Procedures   MEDICATIONS ORDERED IN ED: Medications - No data to display   IMPRESSION / MDM / ASSESSMENT AND PLAN / ED COURSE  I reviewed the triage vital signs and the nursing notes.                              Differential diagnosis includes, but is not limited to, ACS, aortic dissection, pulmonary embolism, cardiac tamponade, pneumothorax, pneumonia, pericarditis, myocarditis, GI-related causes including esophagitis/gastritis, and musculoskeletal chest wall pain.     Patient's presentation is most consistent with acute complicated illness / injury requiring diagnostic workup.  Patient's diagnosis is consistent with nonspecific chest pain, likely of a GI etiology.  Patient presents to the ED for evaluation of epigastric substernal chest pain which she described as pressure.  She denied any diaphoresis, nausea, vomiting, or shortness of breath.  Symptoms by her report, are associated with  some reflux and belching.  Her cardiac workup here is overall reassuring.  She endorses resolution of her symptoms without medication administration at this time.  No evidence of any malignant arrhythmia on EKG.  Chest ray interpreted by me, shows no acute intrathoracic process.  Remaining labs are unremarkable including a negative troponin.  Doubt ACS as symptoms have resolved and appear GI in nature.  Patient will be discharged home with instructions to take her omeprazole to 40 mg daily. Patient is to follow up with GI medicine as suggested, as needed or otherwise directed. Patient is given ED precautions to return to the ED for any worsening or new symptoms.     FINAL CLINICAL IMPRESSION(S) / ED DIAGNOSES   Final diagnoses:  Nonspecific chest pain  Gastroesophageal reflux disease, unspecified whether esophagitis present     Rx / DC Orders   ED Discharge Orders     None        Note:  This document was prepared using Dragon voice recognition software and may include unintentional dictation errors.    Loyd Candida LULLA Aldona, PA-C 07/12/24 2345    Waymond Lorelle Cummins, MD 07/14/24 [phone]

## 2024-08-28 ENCOUNTER — Encounter: Payer: Self-pay | Admitting: Registered Nurse

## 2024-08-28 ENCOUNTER — Ambulatory Visit: Admitting: Registered Nurse

## 2024-08-28 ENCOUNTER — Other Ambulatory Visit: Payer: Self-pay | Admitting: Registered Nurse

## 2024-08-28 VITALS — BP 115/104 | HR 89 | Ht 65.0 in | Wt 228.0 lb

## 2024-08-28 DIAGNOSIS — R234 Changes in skin texture: Secondary | ICD-10-CM

## 2024-08-28 DIAGNOSIS — R238 Other skin changes: Secondary | ICD-10-CM | POA: Diagnosis not present

## 2024-08-28 MED ORDER — CLOBETASOL PROPIONATE 0.05 % EX OINT: 1.0000 | TOPICAL_OINTMENT | Freq: Two times a day (BID) | CUTANEOUS | 0 refills | Status: AC

## 2024-08-28 NOTE — Progress Notes (Signed)
   GYN ENCOUNTER  Subjective  HPI: Brittany Allen is a 38 y.o. G1P1001 who presents today for evaluation of skin changes on her left breast. She has a two month history of a very itchy spot on her left areola. It has gotten bigger over time. It has started to ooze clear fluid and peel a little bit. She has been putting vaseline on it. No history of eczema, but does have a remote history of psoriasis elsewhere on her body.   Past Medical History:  Diagnosis Date   Bacterial vaginitis    BMI 32.0-32.9,adult    Chlamydia    Past Surgical History:  Procedure Laterality Date   WISDOM TOOTH EXTRACTION     OB History     Gravida  1   Para  1   Term  1   Preterm      AB      Living  1      SAB      IAB      Ectopic      Multiple      Live Births  1          No Known Allergies  Constitutional: Denied constitutional symptoms, night sweats, recent illness, fatigue, fever, insomnia and weight loss.  Eyes: Denied eye symptoms, eye pain, photophobia, vision change and visual disturbance.  Ears/Nose/Throat/Neck: Denied ear, nose, throat or neck symptoms, hearing loss, nasal discharge, sinus congestion and sore throat.  Cardiovascular: Denied cardiovascular symptoms, arrhythmia, chest pain/pressure, edema, exercise intolerance, orthopnea and palpitations.  Respiratory: Denied pulmonary symptoms, asthma, pleuritic pain, productive sputum, cough, dyspnea and wheezing.  Gastrointestinal: Denied, gastro-esophageal reflux, melena, nausea and vomiting.  Genitourinary: Denied genitourinary symptoms including symptomatic vaginal discharge, pelvic relaxation issues, and urinary complaints.  Musculoskeletal: Denied musculoskeletal symptoms, stiffness, swelling, muscle weakness and myalgia.  Dermatologic: C.o itching, peeling skin on left areola.  Neurologic: Denied neurology symptoms, dizziness, headache, neck pain and syncope.  Psychiatric: Denied psychiatric symptoms, anxiety and  depression.  Endocrine: Denied endocrine symptoms including hot flashes and night sweats.    Objective  BP (!) 115/104   Pulse 89   Ht 5' 5 (1.651 m)   Wt 228 lb (103.4 kg)   LMP 08/12/2024   BMI 37.94 kg/m  *Photos taken and uploaded into Media section* Left areola has sharply demarcated area of skin change. There is some broken skin that is oozing serous fluid. There is also some skin peeling. No scaling. Area is approximately 2x3cm   Assessment/ Plan Areolar skin changes- likely dermatitis as this occurred after a change in body wash 2 months ago. However, it has not resolved since discontinuing the body wash. Differential includes contact dermatitis, psoriasis (has hx), or less likely, Paget's disease. Will start high potency topical steroid (clobetasol 0.05%) bid and return to see me in 2 weeks for follow up. Images of skin findings have been uploaded to Media section of chart. In interim, will get diagnostic left breast mammogram to start workup for Paget's disease.   Lauraine Lakes, CNM

## 2024-08-31 ENCOUNTER — Ambulatory Visit
Admission: RE | Admit: 2024-08-31 | Discharge: 2024-08-31 | Disposition: A | Discharge status: Home / Self Care | Source: Ambulatory Visit | Attending: Registered Nurse | Admitting: Registered Nurse

## 2024-08-31 ENCOUNTER — Encounter: Payer: Self-pay | Admitting: *Deleted

## 2024-08-31 ENCOUNTER — Other Ambulatory Visit: Payer: Self-pay | Admitting: Registered Nurse

## 2024-08-31 DIAGNOSIS — N6310 Unspecified lump in the right breast, unspecified quadrant: Secondary | ICD-10-CM | POA: Insufficient documentation

## 2024-08-31 DIAGNOSIS — N649 Disorder of breast, unspecified: Secondary | ICD-10-CM

## 2024-08-31 DIAGNOSIS — N6489 Other specified disorders of breast: Secondary | ICD-10-CM

## 2024-08-31 DIAGNOSIS — R234 Changes in skin texture: Secondary | ICD-10-CM | POA: Insufficient documentation

## 2024-08-31 NOTE — Progress Notes (Signed)
 Received inbasket from radiologist Dr. Anice recommending surgical referral for persistent nipple erythma/itching/ and scaling for consideration of punch biopsy.  Referral sent to [facility] per patient preference.   No further needs at this time.

## 2024-09-13 ENCOUNTER — Ambulatory Visit: Admitting: Registered Nurse

## 2024-09-28 ENCOUNTER — Ambulatory Visit: Admitting: Registered Nurse

## 2024-10-17 ENCOUNTER — Ambulatory Visit: Admitting: Registered Nurse

## 2024-10-17 ENCOUNTER — Encounter: Payer: Self-pay | Admitting: Registered Nurse

## 2024-10-17 VITALS — BP 107/69 | HR 73 | Ht 65.0 in | Wt 228.4 lb

## 2024-10-17 DIAGNOSIS — R234 Changes in skin texture: Secondary | ICD-10-CM

## 2024-10-19 DIAGNOSIS — R234 Changes in skin texture: Secondary | ICD-10-CM | POA: Insufficient documentation

## 2024-10-19 NOTE — Progress Notes (Signed)
 Subjective. Presents for f/u on breast skin changes on LEFT areola. Resolved completely after a few days of steroid treatment. She had her mammogram following our last visit and there were some findings on her RIGHT breast that she's having followed up in 6 months.   Objective:  BP 107/69   Pulse 73   Ht 5' 5 (1.651 m)   Wt 228 lb 6.4 oz (103.6 kg)   LMP 09/29/2024   BMI 38.01 kg/m  On exam: skin on left areola is smooth, without lesions, peeling, or patches.  Assessment/ Plan Skin changes were likely 2/2 eczema vs psoriasis. Resolved with steroid cream. Images uploaded to Media section in chart. Would recommend proceeding with biopsy if reoccurs. Pt in agreement.  RTC prn.
